# Patient Record
Sex: Male | Born: 1960 | Race: White | Hispanic: No | Marital: Single | Smoking: Never smoker
Health system: Southern US, Community
[De-identification: ages and names within clinical notes are randomized; demographics above are authoritative.]

## PROBLEM LIST (undated history)

## (undated) DIAGNOSIS — I4891 Unspecified atrial fibrillation: Secondary | ICD-10-CM

---

## 2016-02-11 ENCOUNTER — Encounter (HOSPITAL_COMMUNITY): Payer: Self-pay | Admitting: Emergency Medicine

## 2016-02-11 ENCOUNTER — Emergency Department (HOSPITAL_COMMUNITY)
Admission: EM | Admit: 2016-02-11 | Discharge: 2016-02-12 | Disposition: A | Discharge status: Home / Self Care | Payer: BLUE CROSS/BLUE SHIELD | Attending: Emergency Medicine | Admitting: Emergency Medicine

## 2016-02-11 ENCOUNTER — Emergency Department (HOSPITAL_COMMUNITY): Payer: BLUE CROSS/BLUE SHIELD

## 2016-02-11 DIAGNOSIS — I480 Paroxysmal atrial fibrillation: Secondary | ICD-10-CM | POA: Diagnosis not present

## 2016-02-11 DIAGNOSIS — Z7982 Long term (current) use of aspirin: Secondary | ICD-10-CM | POA: Diagnosis not present

## 2016-02-11 DIAGNOSIS — I4891 Unspecified atrial fibrillation: Secondary | ICD-10-CM

## 2016-02-11 DIAGNOSIS — R0602 Shortness of breath: Secondary | ICD-10-CM | POA: Diagnosis present

## 2016-02-11 HISTORY — DX: Unspecified atrial fibrillation: I48.91

## 2016-02-11 LAB — BASIC METABOLIC PANEL
Anion gap: 9 (ref 5–15)
BUN: 9 mg/dL (ref 6–20)
CHLORIDE: 110 mmol/L (ref 101–111)
CO2: 22 mmol/L (ref 22–32)
CREATININE: 1.33 mg/dL — AB (ref 0.61–1.24)
Calcium: 8.9 mg/dL (ref 8.9–10.3)
GFR calc Af Amer: >60 mL/min (ref >60)
GFR calc non Af Amer: 59 mL/min — ABNORMAL LOW (ref >60)
GLUCOSE: 88 mg/dL (ref 65–99)
Potassium: 3.9 mmol/L (ref 3.5–5.1)
Sodium: 141 mmol/L (ref 135–145)

## 2016-02-11 LAB — CBC
HCT: 49.8 % (ref 39.0–52.0)
Hemoglobin: 17.3 g/dL — ABNORMAL HIGH (ref 13.0–17.0)
MCH: 30.9 pg (ref 26.0–34.0)
MCHC: 34.7 g/dL (ref 30.0–36.0)
MCV: 89.1 fL (ref 78.0–100.0)
PLATELETS: 375 10*3/uL (ref 150–400)
RBC: 5.59 MIL/uL (ref 4.22–5.81)
RDW: 13.5 % (ref 11.5–15.5)
WBC: 9.7 10*3/uL (ref 4.0–10.5)

## 2016-02-11 LAB — I-STAT TROPONIN, ED: Troponin i, poc: 0.01 ng/mL (ref 0.00–0.08)

## 2016-02-11 MED ORDER — APIXABAN 5 MG PO TABS
5.0000 mg | ORAL_TABLET | Freq: Once | ORAL | Status: AC
  Administered 2016-02-12: 5 mg via ORAL
  Filled 2016-02-11: qty 1

## 2016-02-11 MED ORDER — DRONEDARONE HCL 400 MG PO TABS
400.0000 mg | ORAL_TABLET | Freq: Once | ORAL | Status: AC
  Administered 2016-02-11: 400 mg via ORAL
  Filled 2016-02-11: qty 1

## 2016-02-11 MED ORDER — ETOMIDATE 2 MG/ML IV SOLN
20.0000 mg | Freq: Once | INTRAVENOUS | Status: AC
  Administered 2016-02-11: 20 mg via INTRAVENOUS
  Filled 2016-02-11: qty 10

## 2016-02-11 MED ORDER — APIXABAN 5 MG PO TABS: 5.0000 mg | ORAL_TABLET | Freq: Two times a day (BID) | ORAL | Status: DC

## 2016-02-11 NOTE — Sedation Documentation (Signed)
Pt appears groggy and with low SPO2 pt put on blow by oxygen SPO2 raised to 95% ETCO2 32

## 2016-02-11 NOTE — ED Triage Notes (Signed)
[facility] for further eval of SOB and palpitations starting yesterday

## 2016-02-11 NOTE — Sedation Documentation (Signed)
Pt awake now and answering questions appropriately appears in no distress remains on the monitor

## 2016-02-11 NOTE — ED Provider Notes (Signed)
[facility]-EMER[facility]ENCY DEPT Provider Note   CSN: [id number] Arrival date & time: 02/11/16  1545     History   Chief Complaint Chief Complaint  Patient presents with  . Shortness of Breath    HPI Tim Alexander is a 55 y.o. male.  Patient presents with one day of shortness of breath and palpitations that started yesterday evening and kept him from sleeping. History of atrial fibrillation s/p cardioversion x 2 at outside hospital several years ago, states this feels similar. Has not been symptomatic from atrial fibrillation for many years. Takes Multaq and has taken this for over a year but reports he missed several doses this week. Denies recent fever, cough, vomiting ,abdominal pain, or other infectious symptoms. Is not on anticoagulation as of one year ago though used to be on Eliquis.   The history is provided by the patient. No language interpreter was used.  Palpitations   This is a recurrent problem. The current episode started yesterday. The problem occurs daily. The problem has been gradually improving. The problem is associated with an unknown factor. Associated symptoms include chest pressure, irregular heartbeat and shortness of breath. Pertinent negatives include no fever, no chest pain, no exertional chest pressure, no syncope, no abdominal pain, no headaches, no lower extremity edema and no dizziness. Treatments tried: Multaq. The treatment provided no relief. Risk factors: History of atrial fibrillation. His past medical history does not include heart disease or valve disorder.    Past Medical History:  Diagnosis Date  . A-fib (HCC)     There are no active problems to display for this patient.   History reviewed. No pertinent surgical history.     Home Medications    Prior to Admission medications   Medication Sig Start Date End Date Taking? Authorizing Provider  aspirin EC 325 MG tablet Take 325 mg by mouth daily.   Yes Historical Provider, MD  Cholecalciferol  (VITAMIN D3) 1000 units CAPS Take 1,000 Units by mouth daily with breakfast.   Yes Historical Provider, MD  montelukast (SINGULAIR) 10 MG tablet Take 10 mg by mouth at bedtime as needed (for allergic symptoms).  01/17/16  Yes Historical Provider, MD  MULTAQ 400 MG tablet Take 400 mg by mouth 2 (two) times daily. 01/17/16  Yes Historical Provider, MD  Omega-3 Fatty Acids (FISH OIL PO) Take 1 capsule by mouth daily.   Yes Historical Provider, MD  polycarbophil (FIBERCON) 625 MG tablet Take 625 mg by mouth 2 (two) times daily.   Yes Historical Provider, MD  apixaban (ELIQUIS) 5 MG TABS tablet Take 1 tablet (5 mg total) by mouth 2 (two) times daily. 02/12/16   Preston Fleeting, MD  dronedarone (MULTAQ) 400 MG tablet Take 1 tablet (400 mg total) by mouth 2 (two) times daily with a meal. 02/12/16   Zadie Rhine, MD    Family History History reviewed. No pertinent family history.  Social History Social History  Substance Use Topics  . Smoking status: Never Smoker  . Smokeless tobacco: Never Used  . Alcohol use Yes     Comment: occ     Allergies   Patient has no known allergies.   Review of Systems Review of Systems  Constitutional: Negative for fever.  HENT: Negative.   Respiratory: Positive for shortness of breath.   Cardiovascular: Positive for palpitations. Negative for chest pain and syncope.  Gastrointestinal: Negative for abdominal pain.  Genitourinary: Negative.   Musculoskeletal: Negative.   Skin: Negative.   Allergic/Immunologic: Negative for immunocompromised state.  Neurological: Negative for dizziness and headaches.  Hematological: Does not bruise/bleed easily.  Psychiatric/Behavioral: Negative.      Physical Exam Updated Vital Signs BP 118/82   Pulse 63   Temp 98.2 F (36.8 C) (Oral)   Resp 16   Ht 6' (1.829 m)   Wt 131 kg   SpO2 96%   BMI 39.15 kg/m   Physical Exam  Constitutional: He is oriented to person, place, and time. He appears well-developed and  well-nourished. No distress.  HENT:  Head: Normocephalic and atraumatic.  Eyes: Conjunctivae and EOM are normal.  Neck: Normal range of motion. Neck supple.  Cardiovascular: Normal rate, S1 normal, S2 normal, normal heart sounds and intact distal pulses.  An irregularly irregular rhythm present. Exam reveals no gallop and no friction rub.   No murmur heard. Pulmonary/Chest: Effort normal and breath sounds normal. No respiratory distress. He has no wheezes. He has no rales.  Abdominal: Soft. He exhibits no distension. There is no tenderness. There is no rebound and no guarding.  Musculoskeletal: He exhibits no edema.  Neurological: He is alert and oriented to person, place, and time.  Skin: Skin is warm and dry. He is not diaphoretic.  Psychiatric: He has a normal mood and affect. His behavior is normal. Judgment and thought content normal.     ED Treatments / Results  Labs (all labs ordered are listed, but only abnormal results are displayed) Labs Reviewed  BASIC METABOLIC PANEL - Abnormal; Notable for the following:       Result Value   Creatinine, Ser 1.33 (*)    GFR calc non Af Amer 59 (*)    All other components within normal limits  CBC - Abnormal; Notable for the following:    Hemoglobin 17.3 (*)    All other components within normal limits  I-STAT TROPOININ, ED    EKG  EKG Interpretation  Date/Time:  Tuesday February 11 2016 23:05:30 EST Ventricular Rate:  70 PR Interval:    QRS Duration: 87 QT Interval:  366 QTC Calculation: 395 R Axis:   -10 Text Interpretation:  Sinus rhythm Anteroseptal infarct, age indeterminate Confirmed by COOK  MD, BRIAN ([id number]) on 02/12/2016 12:45:57 PM       Radiology Dg Chest 2 View  Result Date: 02/11/2016 CLINICAL DATA:  Shortness of breath. EXAM: CHEST  2 VIEW COMPARISON:  None. FINDINGS: The heart size and mediastinal contours are within normal limits. Both lungs are clear. The visualized skeletal structures are unremarkable.  IMPRESSION: No active cardiopulmonary disease. Electronically Signed   By: Elige KoHetal  Patel   On: 02/11/2016 16:57    Procedures .Sedation Date/Time: 02/12/2016 3:34 PM Performed by: Preston FleetingSCHUBERT, Kemba Hoppes Authorized by: Preston FleetingSCHUBERT, Savyon Loken   Consent:    Consent obtained:  Written   Consent given by:  Patient   Risks discussed:  Inadequate sedation, prolonged hypoxia resulting in organ damage, prolonged sedation necessitating reversal and respiratory compromise necessitating ventilatory assistance and intubation   Alternatives discussed:  Analgesia without sedation Indications:    Procedure performed:  Cardioversion   Procedure necessitating sedation performed by:  Physician performing sedation   Intended level of sedation:  Moderate (conscious sedation) Pre-sedation assessment:    Time since last food or drink:  12 hours   ASA classification: class 2 - patient with mild systemic disease     Neck mobility: normal     Mouth opening:  3 or more finger widths   Mallampati score:  I - soft palate, uvula, fauces, pillars visible  Pre-sedation assessments completed and reviewed: airway patency, cardiovascular function, mental status, nausea/vomiting, pain level, respiratory function and temperature     History of difficult intubation: no     Pre-sedation assessment completed:  02/11/2016 10:00 PM Immediate pre-procedure details:    Reassessment: Patient reassessed immediately prior to procedure     Reviewed: vital signs, relevant labs/tests and NPO status     Verified: bag valve mask available, intubation equipment available, IV patency confirmed, oxygen available and suction available   Procedure details (see MAR for exact dosages):    Preoxygenation:  Nasal cannula   Sedation:  Etomidate   Intra-procedure monitoring:  Blood pressure monitoring, cardiac monitor, continuous capnometry, continuous pulse oximetry, frequent vital sign checks and frequent LOC assessments   Intra-procedure events: none      Intra-procedure management:  Airway repositioning, supplemental oxygen and airway suctioning   Total sedation time (minutes):  25 Post-procedure details:    Attendance: Constant attendance by certified staff until patient recovered     Recovery: Patient returned to pre-procedure baseline     Complications:  None   Patient is stable for discharge or admission: yes     Patient tolerance:  Tolerated well, no immediate complications .Cardioversion Date/Time: 02/12/2016 3:35 PM Performed by: Preston FleetingSCHUBERT, Alyene Predmore Authorized by: Preston FleetingSCHUBERT, Kofi Murrell   Consent:    Consent obtained:  Written   Consent given by:  Patient   Risks discussed:  Death, induced arrhythmia and pain   Alternatives discussed:  No treatment Pre-procedure details:    Cardioversion basis:  Elective   Rhythm:  Atrial fibrillation   Electrode placement:  Anterior-posterior Attempt one:    Cardioversion mode:  Synchronous   Waveform:  Monophasic   Shock (Joules):  200   Shock outcome:  Conversion to normal sinus rhythm Post-procedure details:    Patient status:  Awake   Patient tolerance of procedure:  Tolerated well, no immediate complications   (including critical care time)  Medications Ordered in ED Medications  dronedarone (MULTAQ) tablet 400 mg (400 mg Oral Given 02/11/16 2000)  etomidate (AMIDATE) injection 20 mg (20 mg Intravenous Given 02/11/16 2304)  apixaban (ELIQUIS) tablet 5 mg (5 mg Oral Given 02/12/16 0032)     Initial Impression / Assessment and Plan / ED Course  I have reviewed the triage vital signs and the nursing notes.  Pertinent labs & imaging results that were available during my care of the patient were reviewed by me and considered in my medical decision making (see chart for details).  Clinical Course     Patient presents with symptomatic atrial fibrillation with history of the same, s/p cardioversion x 2 many years ago. Likely in the setting of missing doses of antiarrhythmic, no signs of any  infectious etiology. Also notes increased stress at work. He is normotensive on arrival with rates between 90s-110s and not requiring rate control at this point. Normal mental status. Labs including troponin are unremarkable. EKG without signs of acute ischemic changes though does show atrial fibrillation. Symptoms started within 48 hours (he is typically in sinus rhythm) and his CHADS2VASC score is 0. He meets criteria for ED cardioversion. He was consented for this and it was performed as above using etomidate as sedation. This was well tolerated and he converted to NSR. Had normal neurological examination without focal deficits following this. He was started on Eliquis and instructed to continue Multaq as before, and referred to Afib clinic. Was given return precautions for worsening symptoms and expressed understanding. He is in  good condition for discharge home.  Final Clinical Impressions(s) / ED Diagnoses   Final diagnoses:  Paroxysmal atrial fibrillation [facility])    New Prescriptions Discharge Medication List as of 02/12/2016 12:17 AM    START taking these medications   Details  apixaban (ELIQUIS) 5 MG TABS tablet Take 1 tablet (5 mg total) by mouth 2 (two) times daily., Starting Wed 02/12/2016, Print         Preston Fleeting, MD 02/12/16 1539    Derwood Kaplan, MD 02/13/16 1343

## 2016-02-11 NOTE — Sedation Documentation (Signed)
Pt now responding to voice appears in no distress

## 2016-02-11 NOTE — ED Notes (Signed)
ED Provider at bedside. 

## 2016-02-11 NOTE — Sedation Documentation (Signed)
Procedure complete pt on monitor appears to be in NSR

## 2016-02-11 NOTE — Sedation Documentation (Signed)
Staff at bedside opt is not responsive to verbal stimuli 200J shock delivered

## 2016-02-12 ENCOUNTER — Telehealth (HOSPITAL_COMMUNITY): Payer: Self-pay | Admitting: *Deleted

## 2016-02-12 ENCOUNTER — Encounter (HOSPITAL_BASED_OUTPATIENT_CLINIC_OR_DEPARTMENT_OTHER): Payer: Self-pay | Admitting: Emergency Medicine

## 2016-02-12 MED ORDER — APIXABAN 5 MG PO TABS: 5.0000 mg | ORAL_TABLET | Freq: Two times a day (BID) | ORAL | 0 refills | Status: AC

## 2016-02-12 MED ORDER — DRONEDARONE HCL 400 MG PO TABS: 400.0000 mg | ORAL_TABLET | Freq: Two times a day (BID) | ORAL | 0 refills | Status: AC

## 2016-02-12 NOTE — Discharge Instructions (Signed)
Please take the Eliquis twice a day and continue your Multaq. You will see the A Fib clinic within 2-3 days, please call for an appointment if you do not hear from them. Come back if you get worsening chest pain, feel your heart racing, or if you have any numbness, weakness, or speech difficulty.

## 2016-02-12 NOTE — Telephone Encounter (Signed)
Pt appt made for follow up in Afib clinic.  Pt given directions and phone number.

## 2016-02-12 NOTE — ED Provider Notes (Signed)
Pt ready for d/c home but requests Rx for multaq He reports he has taken this for at least 3 yrs everyday Will give Rx for 10 days while he is awaiting cardiology f/u   Zadie Rhineonald Jaicion Laurie, MD 02/12/16 0105

## 2016-02-14 ENCOUNTER — Encounter (HOSPITAL_COMMUNITY): Payer: Self-pay | Admitting: Nurse Practitioner

## 2016-02-14 ENCOUNTER — Ambulatory Visit (HOSPITAL_COMMUNITY)
Admission: RE | Admit: 2016-02-14 | Discharge: 2016-02-14 | Disposition: A | Discharge status: Home / Self Care | Payer: BLUE CROSS/BLUE SHIELD | Source: Ambulatory Visit | Attending: Nurse Practitioner | Admitting: Nurse Practitioner

## 2016-02-14 VITALS — BP 126/82 | HR 64 | Ht 72.0 in | Wt 298.0 lb

## 2016-02-14 DIAGNOSIS — I480 Paroxysmal atrial fibrillation: Secondary | ICD-10-CM

## 2016-02-14 DIAGNOSIS — Z7901 Long term (current) use of anticoagulants: Secondary | ICD-10-CM | POA: Diagnosis not present

## 2016-02-14 DIAGNOSIS — G4733 Obstructive sleep apnea (adult) (pediatric): Secondary | ICD-10-CM | POA: Insufficient documentation

## 2016-02-14 LAB — HEPATIC FUNCTION PANEL
ALBUMIN: 3.8 g/dL (ref 3.5–5.0)
ALK PHOS: 52 U/L (ref 38–126)
ALT: 24 U/L (ref 17–63)
AST: 21 U/L (ref 15–41)
BILIRUBIN TOTAL: 0.7 mg/dL (ref 0.3–1.2)
Bilirubin, Direct: 0.2 mg/dL (ref 0.1–0.5)
Indirect Bilirubin: 0.5 mg/dL (ref 0.3–0.9)
TOTAL PROTEIN: 6.9 g/dL (ref 6.5–8.1)

## 2016-02-14 LAB — TSH: TSH: 2.686 u[IU]/mL (ref 0.350–4.500)

## 2016-02-14 NOTE — Progress Notes (Addendum)
Primary Care Physician: Pcp Not In System Referring Physician: [facility]   Kimi Mayford KnifeWilliams is a 55 y.o. male with a h/o paroxysmal afib in the afib clinic for f/u of an ER visit. Apparently pt was on multaq but had missed several doses and went tinto afib. He also added that he was under a lot of stress.  He was cardioverted in the ER and put on  eliquis 5 mg bid for 30 days for chadsvasc score of 0. He states that he has been in SR since cardioversion. EKG shows SR today.  He is back on Multaq daily.   Lifestyle factors reviewed. He does have OSA and uses CPAP religiously. No significant alcohol, caffeine or illicit drug use. He does not live in this area and is hired by [employer] on a contract basis. He is not sure how long he will be here. He calls [place]  his home.  He was diagnosed with afib 3 years ago and was in the [place] area at the time. He was cardioverted x 2 around the time of diagnosis. He will try and see the MD in that area about once a year.  Today, he denies symptoms of palpitations, chest pain, shortness of breath, orthopnea, PND, lower extremity edema, dizziness, presyncope, syncope, or neurologic sequela. The patient is tolerating medications without difficulties and is otherwise without complaint today.   Past Medical History:  Diagnosis Date  . A-fib (HCC)    No past surgical history on file.  Current Outpatient Prescriptions  Medication Sig Dispense Refill  . apixaban (ELIQUIS) 5 MG TABS tablet Take 1 tablet (5 mg total) by mouth 2 (two) times daily. 60 tablet 0  . Cholecalciferol (VITAMIN D3) 1000 units CAPS Take 1,000 Units by mouth daily with breakfast.    . dronedarone (MULTAQ) 400 MG tablet Take 1 tablet (400 mg total) by mouth 2 (two) times daily with a meal. 20 tablet 0  . Omega-3 Fatty Acids (FISH OIL PO) Take 1 capsule by mouth daily.    . polycarbophil (FIBERCON) 625 MG tablet Take 625 mg by mouth 2 (two) times daily.    . montelukast (SINGULAIR)  10 MG tablet Take 10 mg by mouth at bedtime as needed (for allergic symptoms).      No current facility-administered medications for this encounter.     No Known Allergies  Social History   Social History  . Marital status: Single    Spouse name: N/A  . Number of children: N/A  . Years of education: N/A   Occupational History  . Not on file.   Social History Main Topics  . Smoking status: Never Smoker  . Smokeless tobacco: Never Used  . Alcohol use Yes     Comment: occ  . Drug use: No  . Sexual activity: Not on file   Other Topics Concern  . Not on file   Social History Narrative  . No narrative on file    No family history on file.  ROS- All systems are reviewed and negative except as per the HPI above  Physical Exam: There were no vitals filed for this visit.  GEN- The patient is well appearing, alert and oriented x 3 today.   Head- normocephalic, atraumatic Eyes-  Sclera clear, conjunctiva pink Ears- hearing intact Oropharynx- clear Neck- supple, no JVP Lymph- no cervical lymphadenopathy Lungs- Clear to ausculation bilaterally, normal work of breathing Heart- Regular rate and rhythm, no murmurs, rubs or gallops, PMI not  laterally displaced GI- soft, NT, ND, + BS Extremities- no clubbing, cyanosis, or edema MS- no significant deformity or atrophy Skin- no rash or lesion Psych- euthymic mood, full affect Neuro- strength and sensation are intact  EKG- NSR, normal EKG.  PR int 182 ms, qrs 90 ms, qtc 422 ms Epic records reviewed  Assessment and Plan: 1. Paroxysmal afib Maintaining SR after successful cardioversion and back on Multaq 400 mg bid 0$ co pay card given as he has private insurance but has paying over 100$ a month Continue eliquis 5 mg bid x 30 days after cardioversion and then can stop drug for chadsvasc score of 0. Bmet/cbc from ER reviewed and WNL but will draw a liver profile and tsh today  2. Lifestyle issues Regular exercise  encouraged Weight loss also encouraged Continue to use CPAP  Limit alcohol and caffeine, not an issue currently No tobacco history  F/u in one month.  Elvina Sidleonna C. Matthew Folksarroll, ANP-C Afib Clinic [facility] [address] [place], [place] [address] [phone]

## 2016-03-12 ENCOUNTER — Encounter (HOSPITAL_COMMUNITY): Payer: Self-pay | Admitting: Nurse Practitioner

## 2016-03-12 ENCOUNTER — Ambulatory Visit (HOSPITAL_COMMUNITY)
Admission: RE | Admit: 2016-03-12 | Discharge: 2016-03-12 | Disposition: A | Discharge status: Home / Self Care | Payer: BLUE CROSS/BLUE SHIELD | Source: Ambulatory Visit | Attending: Nurse Practitioner | Admitting: Nurse Practitioner

## 2016-03-12 VITALS — BP 124/76 | HR 62 | Ht 72.0 in | Wt 309.4 lb

## 2016-03-12 DIAGNOSIS — I480 Paroxysmal atrial fibrillation: Secondary | ICD-10-CM | POA: Insufficient documentation

## 2016-03-12 DIAGNOSIS — F419 Anxiety disorder, unspecified: Secondary | ICD-10-CM | POA: Insufficient documentation

## 2016-03-12 NOTE — Progress Notes (Signed)
Primary Care Physician: Pcp Not In System Ref[facility] Phys[facility]: [facility]   Tim Alexander is a 55 y.o. male with a h/o paroxysmal afib in the afib clinic for f/u of an ER visit. Apparently pt was on multaq but had missed several doses and went tinto afib. He also added that he was under a lot of stress.  He was cardioverted in the ER and put on  eliquis 5 mg bid for 30 days for chadsvasc score of 0. He states that he has been in SR since cardioversion. EKG shows SR today.  He is back on Multaq daily.   Lifestyle factors reviewed. He does have OSA and uses CPAP religiously. No significant alcohol, caffeine or illicit drug use. He does not live in this area and is hired by [employer] on a contract basis. He is not sure how long he will be here. He calls [place]  his home.  He was diagnosed with afib 3 years ago and was in the [place] area at the time. He was cardioverted x 2 around the time of diagnosis. He will try and see the MD in that area about once a year.  F/u 30 days after cardioversion. He has now finished  DOAC. No further afib. He discussed with PCP something for nerves. He was given rx for lexapro, pharmacy would not fill it due to concern of  qtc prolongation. I checked with pharmacy here had a drug that mighy be possible for PCP to try, suggested Buspar at lowest dose, because multaq will increase amount of buspar in his system.  Today, he denies symptoms of palpitations, chest pain, shortness of breath, orthopnea, PND, lower extremity edema, dizziness, presyncope, syncope, or neurologic sequela. The patient is tolerating medications without difficulties and is otherwise without complaint today.   Past Medical History:  Diagnosis Date  . A-fib (HCC)    No past surgical history on file.  Current Outpatient Prescriptions  Medication Sig Dispense Refill  . Cholecalciferol (VITAMIN D3) 1000 units CAPS Take 1,000 Units by mouth daily with breakfast.    . dronedarone (MULTAQ) 400  MG tablet Take 1 tablet (400 mg total) by mouth 2 (two) times daily with a meal. 20 tablet 0  . Omega-3 Fatty Acids (FISH OIL PO) Take 1 capsule by mouth daily.    . polycarbophil (FIBERCON) 625 MG tablet Take 625 mg by mouth 2 (two) times daily.    Marland Kitchen. apixaban (ELIQUIS) 5 MG TABS tablet Take 1 tablet (5 mg total) by mouth 2 (two) times daily. (Patient not taking: Reported on 03/12/2016) 60 tablet 0  . montelukast (SINGULAIR) 10 MG tablet Take 10 mg by mouth at bedtime as needed (for allergic symptoms).      No current facility-administered medications for this encounter.     No Known Allergies  Social History   Social History  . Marital status: Single    Spouse name: N/A  . Number of children: N/A  . Years of education: N/A   Occupational History  . Not on file.   Social History Main Topics  . Smoking status: Never Smoker  . Smokeless tobacco: Never Used  . Alcohol use Yes     Comment: occ  . Drug use: No  . Sexual activity: Not on file   Other Topics Concern  . Not on file   Social History Narrative  . No narrative on file    No family history on file.  ROS- All systems are reviewed  and negative except as per the HPI above  Physical Exam: Vitals:   03/12/16 1525  BP: 124/76  Pulse: 62  Weight: (!) 309 lb 6.4 oz (140.3 kg)  Height: 6' (1.829 m)    GEN- The patient is well appearing, alert and oriented x 3 today.   Head- normocephalic, atraumatic Eyes-  Sclera clear, conjunctiva pink Ears- hearing intact Oropharynx- clear Neck- supple, no JVP Lymph- no cervical lymphadenopathy Lungs- Clear to ausculation bilaterally, normal work of breathing Heart- Regular rate and rhythm, no murmurs, rubs or gallops, PMI not laterally displaced GI- soft, NT, ND, + BS Extremities- no clubbing, cyanosis, or edema MS- no significant deformity or atrophy Skin- no rash or lesion Psych- euthymic mood, full affect Neuro- strength and sensation are intact  EKG- NSR, normal  EKG.  PR int 182 ms, qrs 90 ms, qtc 422 ms Epic records reviewed  Assessment and Plan: 1. Paroxysmal afib Maintaining SR after successful cardioversion and back on Multaq 400 mg bid 0$ co pay card given as he has private insurance but has paying over 100$ a month Finished Eliquis x 30 days and can stay off for chadsvasc score of 0.   2. Lifestyle issues Regular exercise encouraged Weight loss also encouraged Continue to use CPAP  Limit alcohol and caffeine, not an issue currently No tobacco history  3. Anxiety Pharmacy will not fill lexapro prescribed by pcp with multaq due to prolongation of QTc potential Asked pharmacist  Here and said buspar may be an option but would have to be low dose buspar due to multaq may increase concentration in body F/u in 3 month.  Elvina Sidleonna C. Matthew Folksarroll, ANP-C Afib Clinic [facility] [address] [place], [place] [address] [phone]

## 2016-03-17 ENCOUNTER — Telehealth (HOSPITAL_COMMUNITY): Payer: Self-pay | Admitting: *Deleted

## 2016-03-17 MED ORDER — DILTIAZEM HCL 30 MG PO TABS: ORAL_TABLET | ORAL | 3 refills | Status: AC

## 2016-03-17 NOTE — Telephone Encounter (Signed)
Patient called in stating he was back in afib this afternoon. He has no way of checking his vitals but can feel his "heart racing". He does not feel short of breath or any chest pain. Pt has not missed any doses of eliquis or multaq. Discussed with Rudi Cocoonna Carroll NP will call in cardizem 30mg  tablets to use PRN. Instructed pt how to take and begin once he receives from pharmacy. Call tomorrow with update of how he is feeling. If still in afib will bring in for office visit if necessary. Pt verbalized understanding of when he should report to ER.

## 2016-03-19 ENCOUNTER — Telehealth (HOSPITAL_COMMUNITY): Payer: Self-pay | Admitting: *Deleted

## 2016-03-19 NOTE — Telephone Encounter (Signed)
Patient called to report after 3 doses of PRN cardizem he did convert back into NSR.  Instructed pt to monitor frequency of breakthrough afib and if continues to increase may need to relook at AAD therapy change. Pt will call if further issues.

## 2016-06-10 ENCOUNTER — Inpatient Hospital Stay (HOSPITAL_COMMUNITY)
Admission: RE | Admit: 2016-06-10 | Discharge: 2016-06-10 | Disposition: A | Discharge status: Home / Self Care | Payer: BLUE CROSS/BLUE SHIELD | Source: Ambulatory Visit | Attending: Nurse Practitioner | Admitting: Nurse Practitioner

## 2016-06-10 ENCOUNTER — Encounter (HOSPITAL_COMMUNITY): Payer: Self-pay | Admitting: *Deleted

## 2018-03-23 IMAGING — DX DG CHEST 2V
2 series · 2 of 2 positions shown · non-contrast
Comparison: None.

CLINICAL DATA: Shortness of breath.

EXAM:
CHEST  2 VIEW

[chest pa]
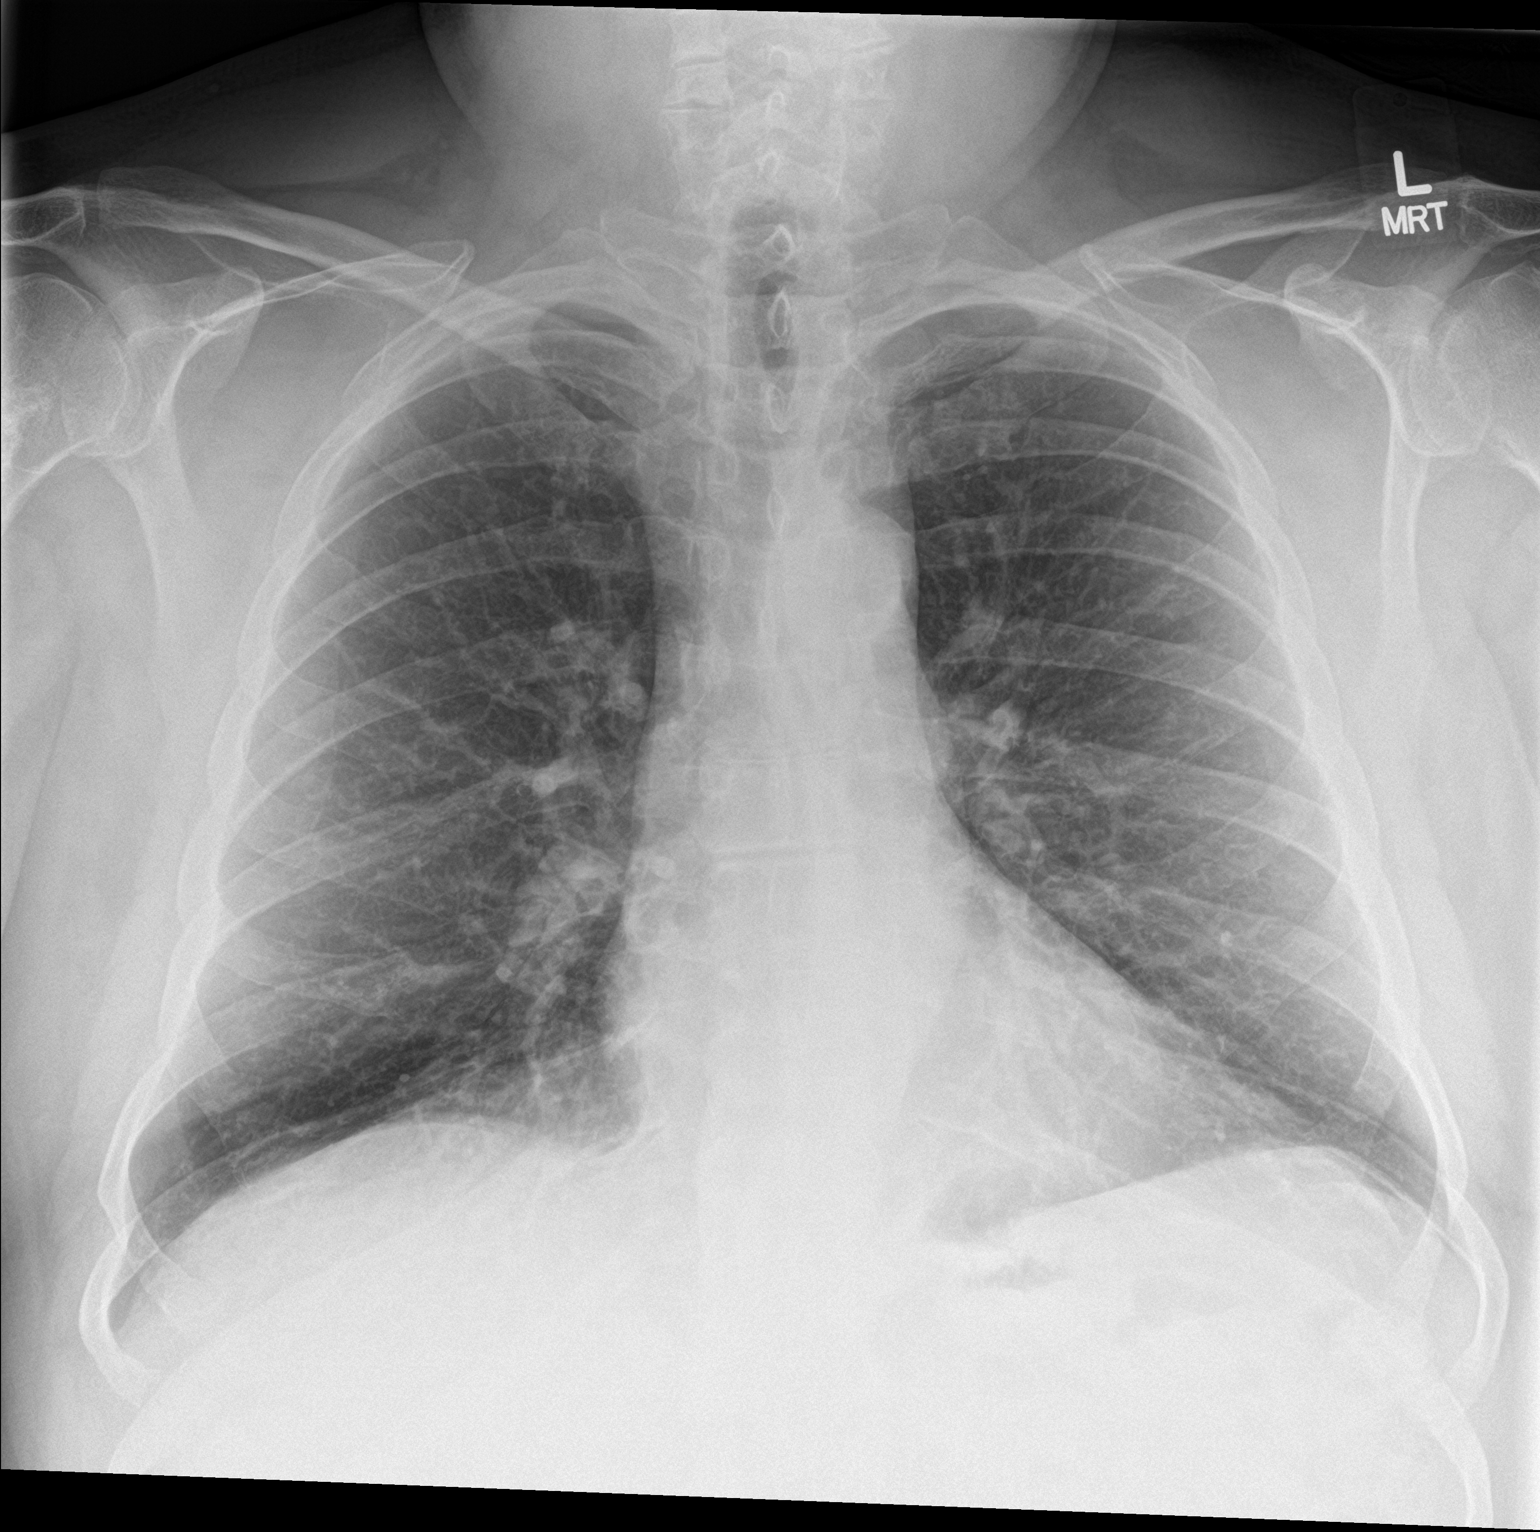

[chest lat]
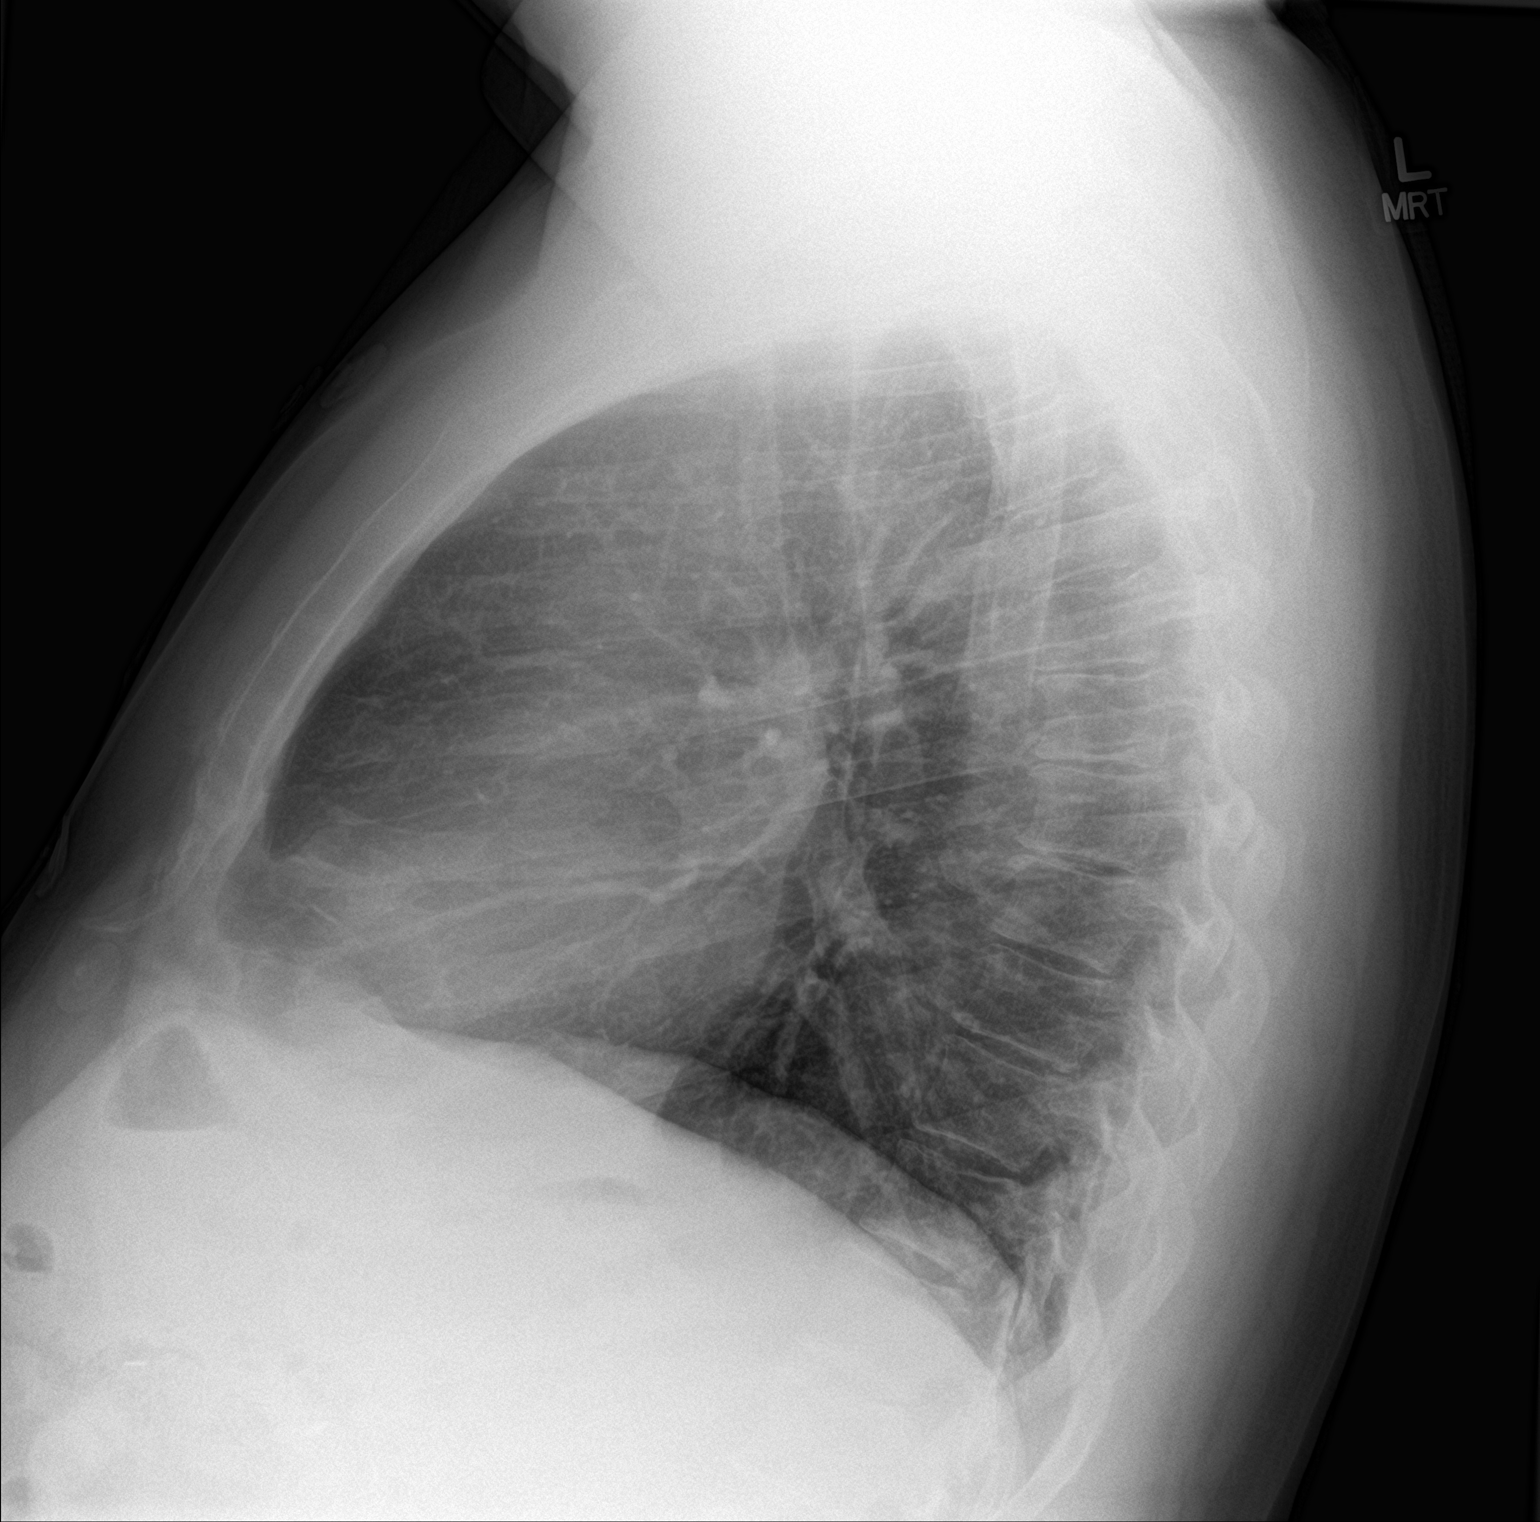

[2 of 2 positions shown; findings below may reference images not displayed]

FINDINGS: The heart size and mediastinal contours are within normal limits.
Both lungs are clear. The visualized skeletal structures are
unremarkable.
IMPRESSION: No active cardiopulmonary disease.
